# Patient Record
Sex: Male | Born: 1970 | Race: Black or African American | Hispanic: No | Marital: Single | Smoking: Never smoker
Health system: Southern US, Community
[De-identification: ages and names within clinical notes are randomized; demographics above are authoritative.]

---

## 2017-07-30 ENCOUNTER — Encounter (HOSPITAL_COMMUNITY): Payer: Self-pay

## 2017-07-30 ENCOUNTER — Emergency Department (HOSPITAL_COMMUNITY): Payer: Self-pay

## 2017-07-30 ENCOUNTER — Emergency Department (HOSPITAL_COMMUNITY)
Admission: EM | Admit: 2017-07-30 | Discharge: 2017-07-30 | Disposition: A | Discharge status: Home / Self Care | Payer: Self-pay | Attending: Emergency Medicine | Admitting: Emergency Medicine

## 2017-07-30 DIAGNOSIS — J45901 Unspecified asthma with (acute) exacerbation: Secondary | ICD-10-CM | POA: Insufficient documentation

## 2017-07-30 DIAGNOSIS — R0602 Shortness of breath: Secondary | ICD-10-CM

## 2017-07-30 LAB — CBC
HEMATOCRIT: 43.8 % (ref 39.0–52.0)
HEMOGLOBIN: 15 g/dL (ref 13.0–17.0)
MCH: 30.5 pg (ref 26.0–34.0)
MCHC: 34.2 g/dL (ref 30.0–36.0)
MCV: 89.2 fL (ref 78.0–100.0)
Platelets: 230 10*3/uL (ref 150–400)
RBC: 4.91 MIL/uL (ref 4.22–5.81)
RDW: 13.5 % (ref 11.5–15.5)
WBC: 6.3 10*3/uL (ref 4.0–10.5)

## 2017-07-30 LAB — BASIC METABOLIC PANEL
Anion gap: 11 (ref 5–15)
BUN: 9 mg/dL (ref 6–20)
CHLORIDE: 105 mmol/L (ref 101–111)
CO2: 23 mmol/L (ref 22–32)
CREATININE: 1.12 mg/dL (ref 0.61–1.24)
Calcium: 9.2 mg/dL (ref 8.9–10.3)
GFR calc non Af Amer: >60 mL/min (ref >60)
Glucose, Bld: 131 mg/dL — ABNORMAL HIGH (ref 65–99)
POTASSIUM: 3.8 mmol/L (ref 3.5–5.1)
SODIUM: 139 mmol/L (ref 135–145)

## 2017-07-30 LAB — I-STAT TROPONIN, ED: TROPONIN I, POC: 0 ng/mL (ref 0.00–0.08)

## 2017-07-30 MED ORDER — ALBUTEROL (5 MG/ML) CONTINUOUS INHALATION SOLN
10.0000 mg/h | INHALATION_SOLUTION | Freq: Once | RESPIRATORY_TRACT | Status: AC
  Administered 2017-07-30: 10 mg/h via RESPIRATORY_TRACT
  Filled 2017-07-30: qty 0.5

## 2017-07-30 MED ORDER — ALBUTEROL (5 MG/ML) CONTINUOUS INHALATION SOLN
10.0000 mg/h | INHALATION_SOLUTION | Freq: Once | RESPIRATORY_TRACT | Status: AC
Start: 2017-07-30 — End: 2017-07-30
  Administered 2017-07-30: 10 mg/h via RESPIRATORY_TRACT
  Filled 2017-07-30: qty 0.5

## 2017-07-30 MED ORDER — DEXAMETHASONE SODIUM PHOSPHATE 10 MG/ML IJ SOLN
10.0000 mg | Freq: Once | INTRAMUSCULAR | Status: AC
  Administered 2017-07-30: 10 mg via INTRAVENOUS
  Filled 2017-07-30: qty 1

## 2017-07-30 MED ORDER — ALBUTEROL SULFATE (2.5 MG/3ML) 0.083% IN NEBU
5.0000 mg | INHALATION_SOLUTION | Freq: Once | RESPIRATORY_TRACT | Status: AC
  Administered 2017-07-30: 5 mg via RESPIRATORY_TRACT

## 2017-07-30 MED ORDER — ALBUTEROL SULFATE (2.5 MG/3ML) 0.083% IN NEBU
INHALATION_SOLUTION | RESPIRATORY_TRACT | Status: AC
  Filled 2017-07-30: qty 6

## 2017-07-30 MED ORDER — IPRATROPIUM-ALBUTEROL 0.5-2.5 (3) MG/3ML IN SOLN
3.0000 mL | Freq: Once | RESPIRATORY_TRACT | Status: AC
  Administered 2017-07-30: 3 mL via RESPIRATORY_TRACT
  Filled 2017-07-30: qty 3

## 2017-07-30 MED ORDER — ALBUTEROL SULFATE HFA 108 (90 BASE) MCG/ACT IN AERS
2.0000 | INHALATION_SPRAY | Freq: Once | RESPIRATORY_TRACT | Status: AC
  Administered 2017-07-30: 2 via RESPIRATORY_TRACT
  Filled 2017-07-30: qty 6.7

## 2017-07-30 MED ORDER — PREDNISONE 20 MG PO TABS: 40.0000 mg | ORAL_TABLET | Freq: Every day | ORAL | 0 refills | Status: AC

## 2017-07-30 MED ORDER — PREDNISONE 20 MG PO TABS: 60.0000 mg | ORAL_TABLET | Freq: Once | ORAL | Status: DC

## 2017-07-30 NOTE — ED Notes (Signed)
Charge Rn notified for room d/t pts unchanged asthma after hour long albuterol  tx

## 2017-07-30 NOTE — Discharge Instructions (Signed)
As discussed, it is very important he follow-up with your physician upon your return to [place]. Return here for concerning changes in your condition.  For the next 48 hours please be sure to use the provided albuterol inhaler every 4 hours. You also need to take the prescribed steroids as directed.

## 2017-07-30 NOTE — ED Provider Notes (Signed)
[facility] Provider Note   CSN: [id number] Arrival date & time: 07/30/17  [id number]     History   Chief Complaint Chief Complaint  Patient presents with  . Chest Pain  . Asthma    HPI Nicholas Dickson is a 46 y.o. male.  HPI  Dyspnea, mild chest tightness. Patient has a history of asthma. He notes that over the past 2 or 3 days he has had increasing dyspnea, chest tightness with coughing, deep breathing. Transient relief with home inhaler He denies of the medical problems including cardiac disease, other pulmonary disease. No recent fever, chills.   History reviewed. No pertinent past medical history.  There are no active problems to display for this patient.   History reviewed. No pertinent surgical history.     Home Medications    Prior to Admission medications   Medication Sig Start Date End Date Taking? Authorizing Provider  albuterol (PROVENTIL HFA;VENTOLIN HFA) 108 (90 Base) MCG/ACT inhaler Inhale 2 puffs into the lungs every 6 (six) hours as needed for wheezing or shortness of breath.   Yes [provider]  budesonide-formoterol (SYMBICORT) 160-4.5 MCG/ACT inhaler Inhale into the lungs 2 (two) times daily.   Yes [provider]  Multiple Vitamin (MULTIVITAMIN WITH MINERALS) TABS tablet Take 1 tablet by mouth daily.   Yes [provider]    Family History No family history on file.  Social History Social History  Substance Use Topics  . Smoking status: Never Smoker  . Smokeless tobacco: Not on file  . Alcohol use Yes     Allergies   Patient has no allergy information on record.   Review of Systems Review of Systems  Constitutional:       Per HPI, otherwise negative  HENT:       Per HPI, otherwise negative  Respiratory:       Per HPI, otherwise negative  Cardiovascular:       Per HPI, otherwise negative  Gastrointestinal: Negative for vomiting.  Endocrine:       Negative aside  from HPI  Genitourinary:       Neg aside from HPI   Musculoskeletal:       Per HPI, otherwise negative  Skin: Negative.   Neurological: Negative for syncope.     Physical Exam Updated Vital Signs BP (!) 162/85   Pulse 95   Temp 97.7 F (36.5 C) (Oral)   Resp 18   SpO2 99%   Physical Exam  Constitutional: He is oriented to person, place, and time. He appears well-developed. No distress.  HENT:  Head: Normocephalic and atraumatic.  Eyes: Conjunctivae and EOM are normal.  Cardiovascular: Normal rate and regular rhythm.   Pulmonary/Chest: No stridor. He has decreased breath sounds. He has wheezes.  Abdominal: He exhibits no distension.  Musculoskeletal: He exhibits no edema.  Neurological: He is alert and oriented to person, place, and time.  Skin: Skin is warm and dry.  Psychiatric: He has a normal mood and affect.  Nursing note and vitals reviewed.    ED Treatments / Results  Labs (all labs ordered are listed, but only abnormal results are displayed) Labs Reviewed  BASIC METABOLIC PANEL - Abnormal; Notable for the following:       Result Value   Glucose, Bld 131 (*)    All other components within normal limits  CBC  I-STAT TROPONIN, ED    EKG  EKG Interpretation  Date/Time:  Tuesday July 30 2017 06:59:10 EDT  Ventricular Rate:  102 PR Interval:  170 QRS Duration: 86 QT Interval:  338 QTC Calculation: 440 R Axis:   51 Text Interpretation:  Sinus tachycardia Nonspecific T wave abnormality Abnormal ekg Confirmed by Gerhard Munch [phone]) on 07/30/2017 9:55:09 AM       Radiology Dg Chest 2 View  Result Date: 07/30/2017 CLINICAL DATA:  Chest pain since yesterday. EXAM: CHEST  2 VIEW COMPARISON:  None. FINDINGS: Lungs are clear. Heart size is normal. No pneumothorax or pleural fluid. No bony abnormality. IMPRESSION: Negative chest. Electronically Signed   By: Drusilla Kanner M.D.   On: 07/30/2017 10:34    Procedures Procedures (including critical care  time)  Medications Ordered in ED Medications  albuterol (PROVENTIL) (2.5 MG/3ML) 0.083% nebulizer solution (not administered)  predniSONE (DELTASONE) tablet 60 mg (60 mg Oral Not Given 07/30/17 0730)  albuterol (PROVENTIL HFA;VENTOLIN HFA) 108 (90 Base) MCG/ACT inhaler 2 puff (not administered)  albuterol (PROVENTIL) (2.5 MG/3ML) 0.083% nebulizer solution 5 mg (5 mg Nebulization Given 07/30/17 0705)  albuterol (PROVENTIL,VENTOLIN) solution continuous neb (10 mg/hr Nebulization Given 07/30/17 0739)  dexamethasone (DECADRON) injection 10 mg (10 mg Intravenous Given 07/30/17 1105)  ipratropium-albuterol (DUONEB) 0.5-2.5 (3) MG/3ML nebulizer solution 3 mL (3 mLs Nebulization Given 07/30/17 1105)  albuterol (PROVENTIL,VENTOLIN) solution continuous neb (10 mg/hr Nebulization Given 07/30/17 1243)     Initial Impression / Assessment and Plan / ED Course  I have reviewed the triage vital signs and the nursing notes.  Pertinent labs & imaging results that were available during my care of the patient were reviewed by me and considered in my medical decision making (see chart for details).    For the initial continuous breathing session the patient continued to have increased work of breathing, wheezing. Update: Now after multiple breathing treatments, revision of steroids the patient has improved, but has audible wheezing.  Update:, Now following a second continuous nebulizer session the patient is awake and alert, with minimal audible wheezing, no increased work of breathing. Minimal tachycardia likely secondary to albuterol. Patient will be provided a replacement inhaler, discharged with ongoing steroids. With no evidence for pneumonia, bacteremia, sepsis, and substantial improvement, the patient was discharged in stable condition.  Final Clinical Impressions(s) / ED Diagnoses  Asthma exacerbation   Gerhard Munch, MD 07/30/17 1423

## 2018-10-12 IMAGING — CR DG CHEST 2V
2 series · 2 of 2 positions shown · non-contrast
Comparison: None.

CLINICAL DATA: Chest pain since yesterday.

EXAM:
CHEST  2 VIEW

[chest lat]
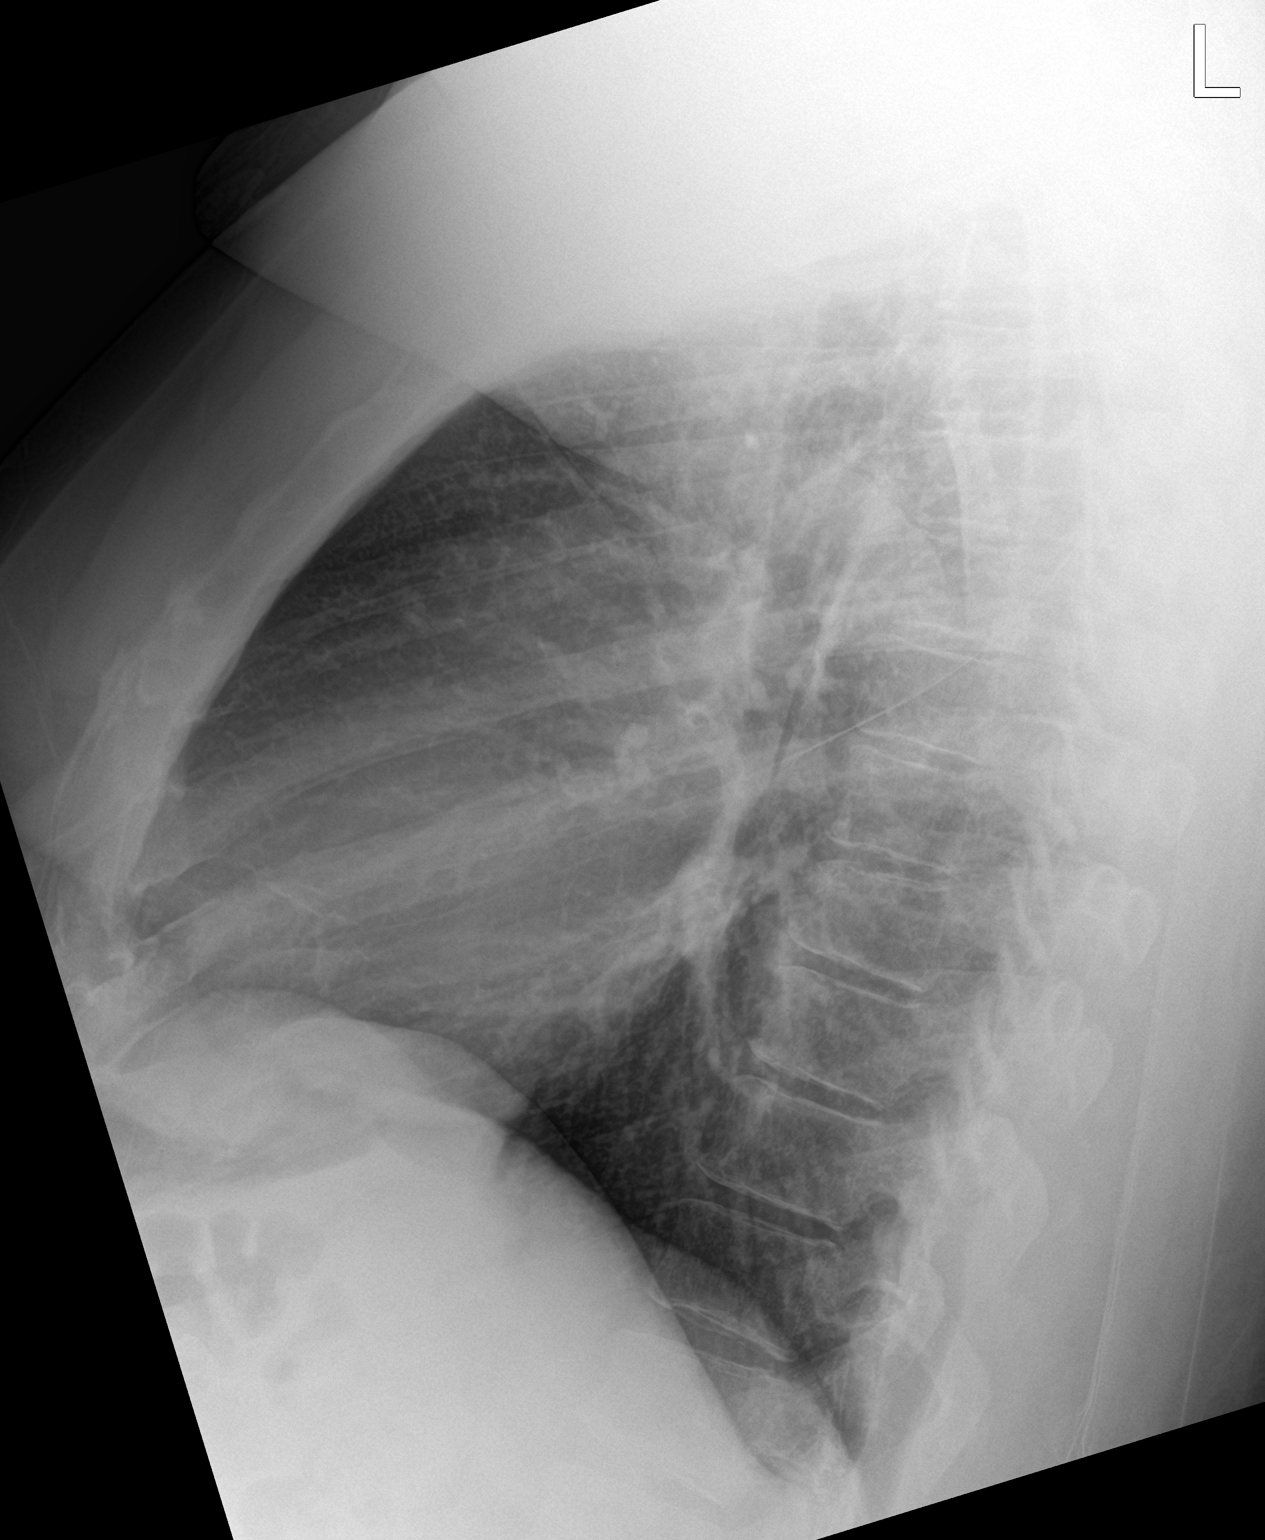

[chest ap]
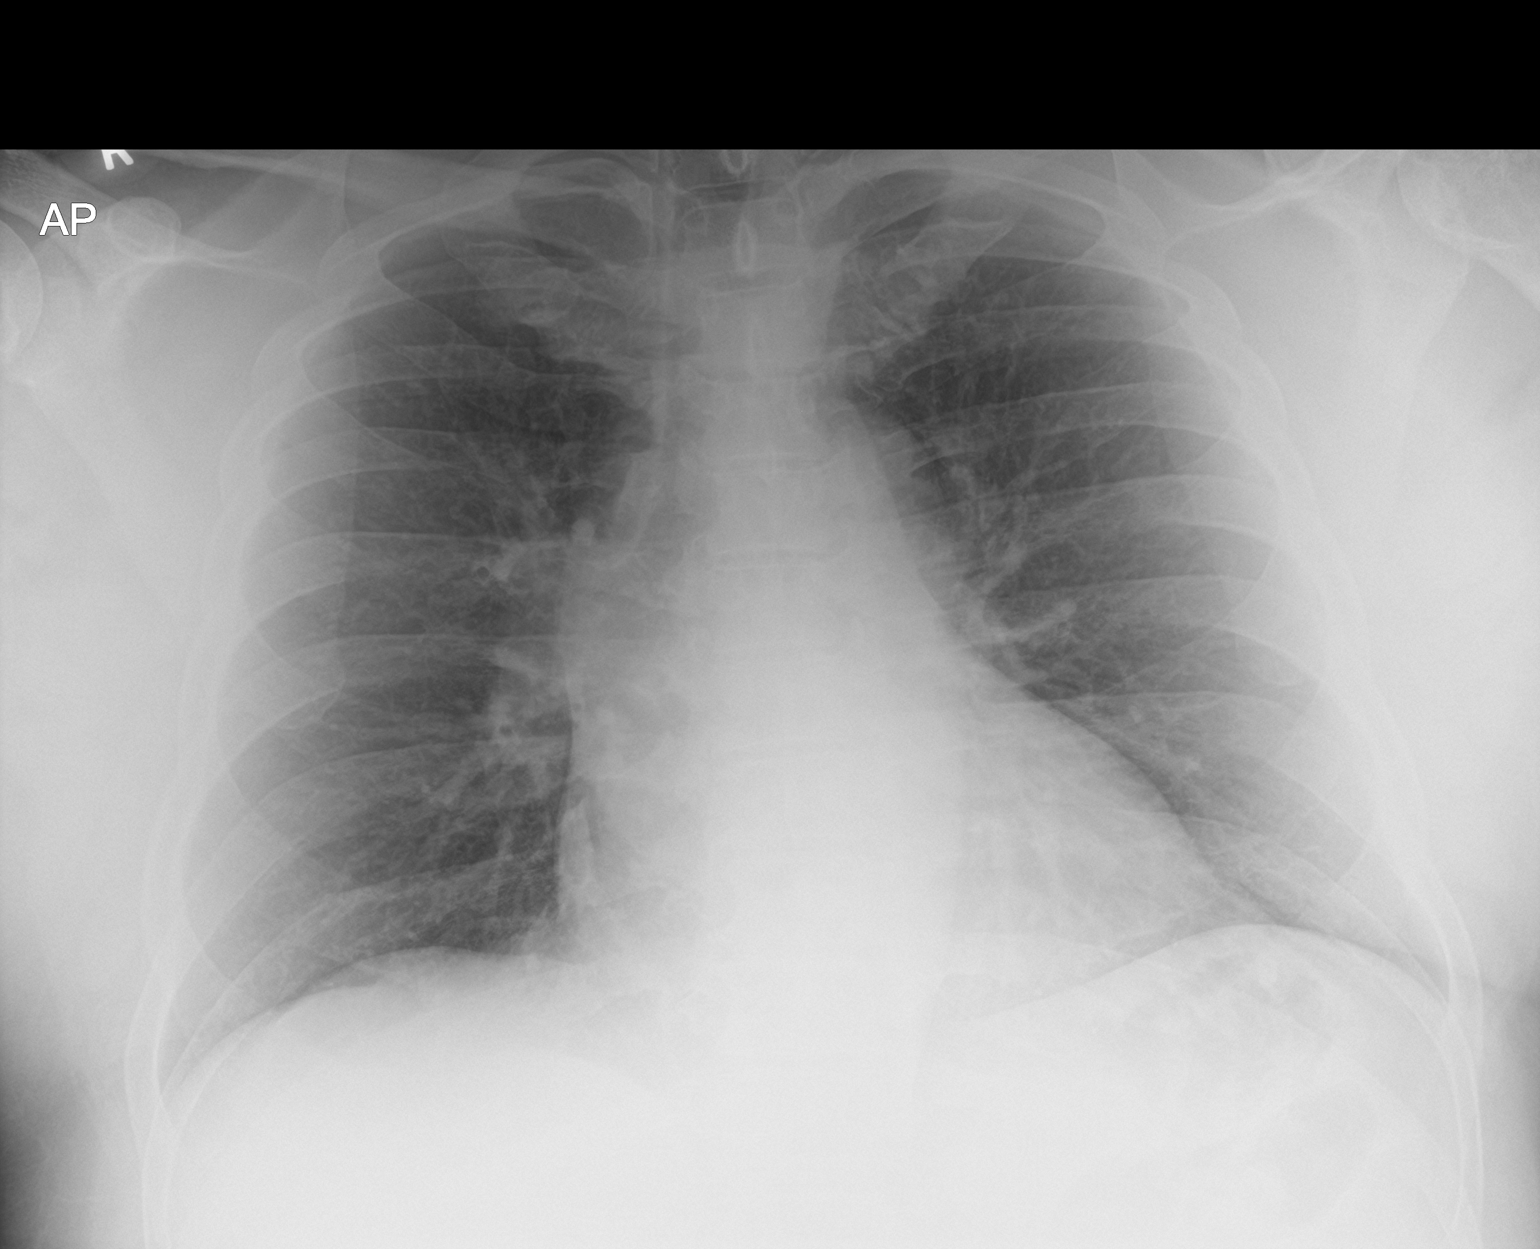

[2 of 2 positions shown; findings below may reference images not displayed]

FINDINGS: Lungs are clear. Heart size is normal. No pneumothorax or pleural
fluid. No bony abnormality.
IMPRESSION: Negative chest.
